# Patient Record
Sex: Female | Born: 1995 | Hispanic: Yes | Marital: Single | Smoking: Never smoker
Health system: Southern US, Community
[De-identification: ages and names within clinical notes are randomized; demographics above are authoritative.]

---

## 2014-03-10 ENCOUNTER — Ambulatory Visit: Payer: Self-pay | Admitting: Family Medicine

## 2014-08-05 ENCOUNTER — Inpatient Hospital Stay: Payer: Self-pay

## 2014-08-05 LAB — PROTEIN / CREATININE RATIO, URINE
Creatinine, Urine: 57.6 mg/dL (ref 30.0–125.0)
PROTEIN/CREAT. RATIO: 2969 mg/g{creat} — AB (ref 0–200)
Protein, Random Urine: 171 mg/dL — ABNORMAL HIGH (ref 0–12)

## 2014-08-05 LAB — CBC WITH DIFFERENTIAL/PLATELET
BASOS PCT: 0.4 %
Basophil #: 0 10*3/uL (ref 0.0–0.1)
EOS ABS: 0.2 10*3/uL (ref 0.0–0.7)
EOS PCT: 2.6 %
HCT: 38 % (ref 35.0–47.0)
HGB: 12.6 g/dL (ref 12.0–16.0)
LYMPHS ABS: 2.3 10*3/uL (ref 1.0–3.6)
Lymphocyte %: 27.5 %
MCH: 29 pg (ref 26.0–34.0)
MCHC: 33.1 g/dL (ref 32.0–36.0)
MCV: 87 fL (ref 80–100)
Monocyte #: 0.5 x10 3/mm (ref 0.2–0.9)
Monocyte %: 5.8 %
NEUTROS PCT: 63.7 %
Neutrophil #: 5.3 10*3/uL (ref 1.4–6.5)
Platelet: 130 10*3/uL — ABNORMAL LOW (ref 150–440)
RBC: 4.35 10*6/uL (ref 3.80–5.20)
RDW: 15.5 % — ABNORMAL HIGH (ref 11.5–14.5)
WBC: 8.3 10*3/uL (ref 3.6–11.0)

## 2014-08-05 LAB — GC/CHLAMYDIA PROBE AMP

## 2014-08-06 LAB — CBC WITH DIFFERENTIAL/PLATELET
BASOS PCT: 0.2 %
Basophil #: 0 10*3/uL (ref 0.0–0.1)
EOS ABS: 0 10*3/uL (ref 0.0–0.7)
Eosinophil %: 0.1 %
HCT: 25 % — AB (ref 35.0–47.0)
HGB: 7.9 g/dL — AB (ref 12.0–16.0)
Lymphocyte #: 2.2 10*3/uL (ref 1.0–3.6)
Lymphocyte %: 12.6 %
MCH: 27.9 pg (ref 26.0–34.0)
MCHC: 31.7 g/dL — AB (ref 32.0–36.0)
MCV: 88 fL (ref 80–100)
Monocyte #: 0.9 x10 3/mm (ref 0.2–0.9)
Monocyte %: 5.3 %
NEUTROS ABS: 14.1 10*3/uL — AB (ref 1.4–6.5)
Neutrophil %: 81.8 %
Platelet: 114 10*3/uL — ABNORMAL LOW (ref 150–440)
RBC: 2.84 10*6/uL — AB (ref 3.80–5.20)
RDW: 15.4 % — ABNORMAL HIGH (ref 11.5–14.5)
WBC: 17.2 10*3/uL — ABNORMAL HIGH (ref 3.6–11.0)

## 2014-08-06 LAB — BASIC METABOLIC PANEL
Anion Gap: 6 — ABNORMAL LOW (ref 7–16)
BUN: 22 mg/dL — AB (ref 7–18)
Calcium, Total: 7.7 mg/dL — ABNORMAL LOW (ref 8.5–10.1)
Chloride: 109 mmol/L — ABNORMAL HIGH (ref 98–107)
Co2: 24 mmol/L (ref 21–32)
Creatinine: 1.35 mg/dL — ABNORMAL HIGH (ref 0.60–1.30)
GFR CALC NON AF AMER: 53 — AB
GLUCOSE: 89 mg/dL (ref 65–99)
OSMOLALITY: 280 (ref 275–301)
POTASSIUM: 4.7 mmol/L (ref 3.5–5.1)
Sodium: 139 mmol/L (ref 136–145)

## 2014-08-06 LAB — PLATELET COUNT: PLATELETS: 121 10*3/uL — AB (ref 150–440)

## 2014-08-07 LAB — PIH PROFILE
ANION GAP: 6 — AB (ref 7–16)
AST: 28 U/L (ref 15–37)
BUN: 22 mg/dL — ABNORMAL HIGH (ref 7–18)
CALCIUM: 7.7 mg/dL — AB (ref 8.5–10.1)
Chloride: 110 mmol/L — ABNORMAL HIGH (ref 98–107)
Co2: 25 mmol/L (ref 21–32)
Creatinine: 1.05 mg/dL (ref 0.60–1.30)
EGFR (Non-African Amer.): >60
GLUCOSE: 82 mg/dL (ref 65–99)
HCT: 22.1 % — ABNORMAL LOW (ref 35.0–47.0)
HGB: 7.2 g/dL — ABNORMAL LOW (ref 12.0–16.0)
MCH: 28.8 pg (ref 26.0–34.0)
MCHC: 32.6 g/dL (ref 32.0–36.0)
MCV: 88 fL (ref 80–100)
Osmolality: 284 (ref 275–301)
Platelet: 105 10*3/uL — ABNORMAL LOW (ref 150–440)
Potassium: 4.4 mmol/L (ref 3.5–5.1)
RBC: 2.51 10*6/uL — ABNORMAL LOW (ref 3.80–5.20)
RDW: 16.1 % — AB (ref 11.5–14.5)
SODIUM: 141 mmol/L (ref 136–145)
URIC ACID: 6.6 mg/dL — AB (ref 2.6–6.0)
WBC: 11.8 10*3/uL — AB (ref 3.6–11.0)

## 2014-08-08 LAB — COMPREHENSIVE METABOLIC PANEL
Albumin: 1.8 g/dL — ABNORMAL LOW (ref 3.4–5.0)
Alkaline Phosphatase: 110 U/L (ref 46–116)
Anion Gap: 7 (ref 7–16)
BILIRUBIN TOTAL: 0.1 mg/dL — AB (ref 0.2–1.0)
BUN: 21 mg/dL — AB (ref 7–18)
CO2: 26 mmol/L (ref 21–32)
CREATININE: 0.81 mg/dL (ref 0.60–1.30)
Calcium, Total: 7.9 mg/dL — ABNORMAL LOW (ref 8.5–10.1)
Chloride: 110 mmol/L — ABNORMAL HIGH (ref 98–107)
EGFR (African American): >60
Glucose: 74 mg/dL (ref 65–99)
OSMOLALITY: 287 (ref 275–301)
POTASSIUM: 4.5 mmol/L (ref 3.5–5.1)
SGOT(AST): 33 U/L (ref 15–37)
SGPT (ALT): 24 U/L (ref 14–63)
SODIUM: 143 mmol/L (ref 136–145)
Total Protein: 4.7 g/dL — ABNORMAL LOW (ref 6.4–8.2)

## 2014-08-08 LAB — CBC WITH DIFFERENTIAL/PLATELET
BASOS ABS: 0 10*3/uL (ref 0.0–0.1)
Basophil %: 0.2 %
EOS ABS: 0.2 10*3/uL (ref 0.0–0.7)
EOS PCT: 2 %
HCT: 21.7 % — ABNORMAL LOW (ref 35.0–47.0)
HGB: 7.1 g/dL — ABNORMAL LOW (ref 12.0–16.0)
LYMPHS PCT: 21.4 %
Lymphocyte #: 2.1 10*3/uL (ref 1.0–3.6)
MCH: 28.8 pg (ref 26.0–34.0)
MCHC: 32.5 g/dL (ref 32.0–36.0)
MCV: 89 fL (ref 80–100)
Monocyte #: 0.6 x10 3/mm (ref 0.2–0.9)
Monocyte %: 5.7 %
Neutrophil #: 7 10*3/uL — ABNORMAL HIGH (ref 1.4–6.5)
Neutrophil %: 70.7 %
Platelet: 109 10*3/uL — ABNORMAL LOW (ref 150–440)
RBC: 2.45 10*6/uL — AB (ref 3.80–5.20)
RDW: 16 % — ABNORMAL HIGH (ref 11.5–14.5)
WBC: 9.9 10*3/uL (ref 3.6–11.0)

## 2014-08-09 LAB — CBC WITH DIFFERENTIAL/PLATELET
Basophil #: 0 10*3/uL (ref 0.0–0.1)
Basophil %: 0.1 %
EOS ABS: 0.3 10*3/uL (ref 0.0–0.7)
EOS PCT: 3.2 %
HCT: 21.8 % — ABNORMAL LOW (ref 35.0–47.0)
HGB: 7.2 g/dL — AB (ref 12.0–16.0)
LYMPHS PCT: 25 %
Lymphocyte #: 2.2 10*3/uL (ref 1.0–3.6)
MCH: 29 pg (ref 26.0–34.0)
MCHC: 32.8 g/dL (ref 32.0–36.0)
MCV: 89 fL (ref 80–100)
MONOS PCT: 4.7 %
Monocyte #: 0.4 x10 3/mm (ref 0.2–0.9)
Neutrophil #: 5.8 10*3/uL (ref 1.4–6.5)
Neutrophil %: 67 %
Platelet: 125 10*3/uL — ABNORMAL LOW (ref 150–440)
RBC: 2.47 10*6/uL — ABNORMAL LOW (ref 3.80–5.20)
RDW: 16.3 % — AB (ref 11.5–14.5)
WBC: 8.7 10*3/uL (ref 3.6–11.0)

## 2014-08-09 LAB — COMPREHENSIVE METABOLIC PANEL
ANION GAP: 8 (ref 7–16)
Albumin: 1.9 g/dL — ABNORMAL LOW (ref 3.4–5.0)
Alkaline Phosphatase: 106 U/L (ref 46–116)
BUN: 15 mg/dL (ref 7–18)
Bilirubin,Total: 0.1 mg/dL — ABNORMAL LOW (ref 0.2–1.0)
CALCIUM: 8 mg/dL — AB (ref 8.5–10.1)
Chloride: 109 mmol/L — ABNORMAL HIGH (ref 98–107)
Co2: 25 mmol/L (ref 21–32)
Creatinine: 0.71 mg/dL (ref 0.60–1.30)
EGFR (African American): >60
EGFR (Non-African Amer.): >60
Glucose: 78 mg/dL (ref 65–99)
OSMOLALITY: 283 (ref 275–301)
POTASSIUM: 4.1 mmol/L (ref 3.5–5.1)
SGOT(AST): 45 U/L — ABNORMAL HIGH (ref 15–37)
SGPT (ALT): 28 U/L (ref 14–63)
Sodium: 142 mmol/L (ref 136–145)
Total Protein: 5 g/dL — ABNORMAL LOW (ref 6.4–8.2)

## 2014-11-14 NOTE — H&P (Signed)
L&D Evaluation:  History:  HPI 19 y/o G1 @ 40/4wks EDC 07/31/14 here for IOL. Care @ [facility]. Well pregnancy. Irregular mild uc's, denies leaking fluid or vaginal bleeding, baby is active. Denies s/sx Pre/e no HA, visual disturbances, NV, RUQ or epigastric pain. GBS negative.   Presents with above   Patient's Medical History No Chronic Illness   Patient's Surgical History none   Medications Pre Natal Vitamins   Allergies NKDA   Social History none   Family History Non-Contributory   ROS:  ROS All systems were reviewed.  HEENT, CNS, GI, GU, Respiratory, CV, Renal and Musculoskeletal systems were found to be normal.   Exam:  Vital Signs stable  Initial B/P elevated Lower on left side   Urine Protein to lab   General no apparent distress   Mental Status clear   Chest clear   Heart normal sinus rhythm   Abdomen gravid, non-tender   Estimated Fetal Weight Average for gestational age   Fetal Position vtx   Fundal Height term   Back no CVAT   Edema 2+  pedal   Reflexes 2+   Clonus negative   Pelvic no external lesions, 4cm 70% vtx @ -2 BOWI no show   Mebranes Intact   FHT normal rate with no decels, baseline 140's 150's avg variability with accels   Fetal Heart Rate 144   Ucx EFM applied   Skin dry   Lymph no lymphadenopathy   Impression:  Impression early labor   Plan:  Plan EFM/NST, monitor contractions and for cervical change   Comments Admitted, explained what to expect 1st baby. Family partner at bedside, supportive. Plans epidural with labor progress. Will insert cervidil.   Electronic Signatures: Albertina ParrLugiano, Matilynn Dacey B (CNM)  (Signed 30-Jan-16 20:55)  Authored: L&D Evaluation   Last Updated: 30-Jan-16 20:55 by Albertina ParrLugiano, Chesney Suares B (CNM)

## 2015-05-05 IMAGING — US US OB US >=[ID] SNGL FETUS
1 series · 14 of 28 positions shown · non-contrast
Comparison: none

CLINICAL DATA: Pregnancy.

EXAM:
ULTRASOUND OB >=UXSKG SINGLE FETUS

[Series 1: us ob us >=(id) sngl fetus · 0.23mm/px · 14 of 76 slices shown]
[im 3/76]
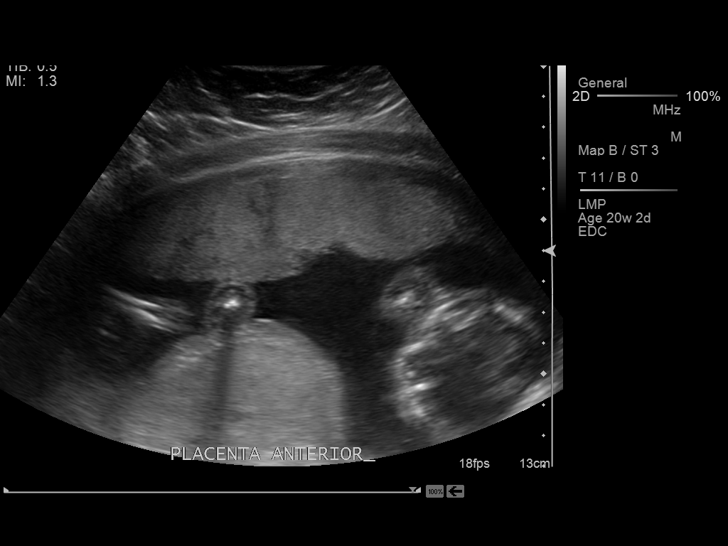
[im 9/76]
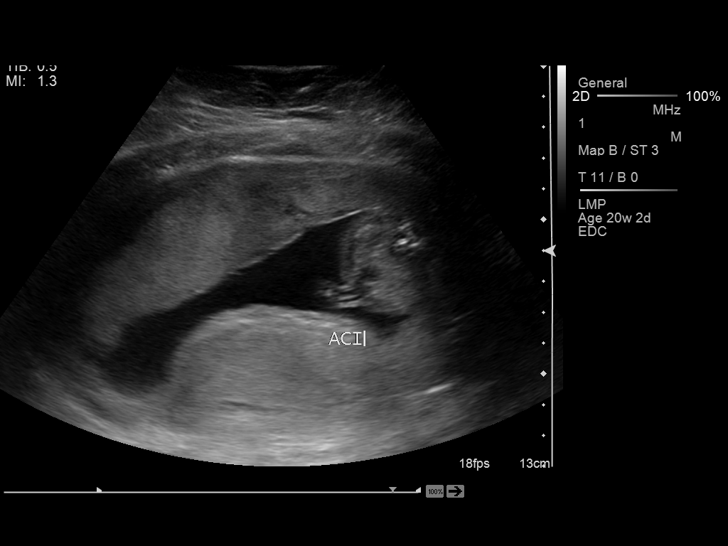
[im 14/76]
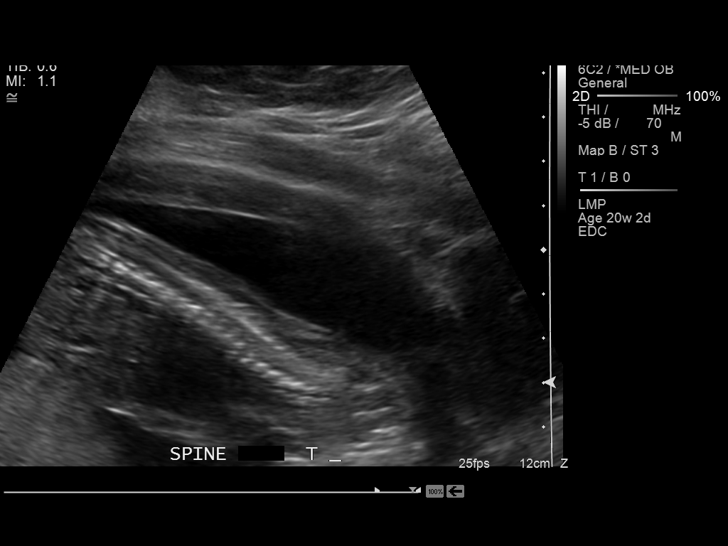
[im 20/76]
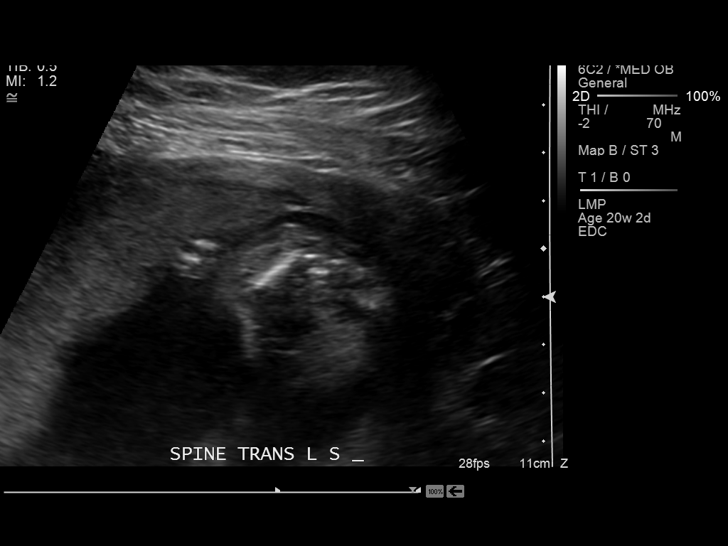
[im 26/76]
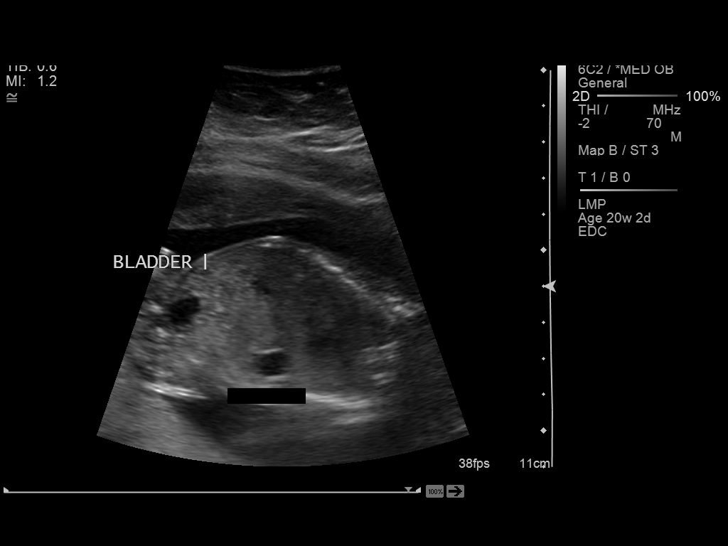
[im 31/76]
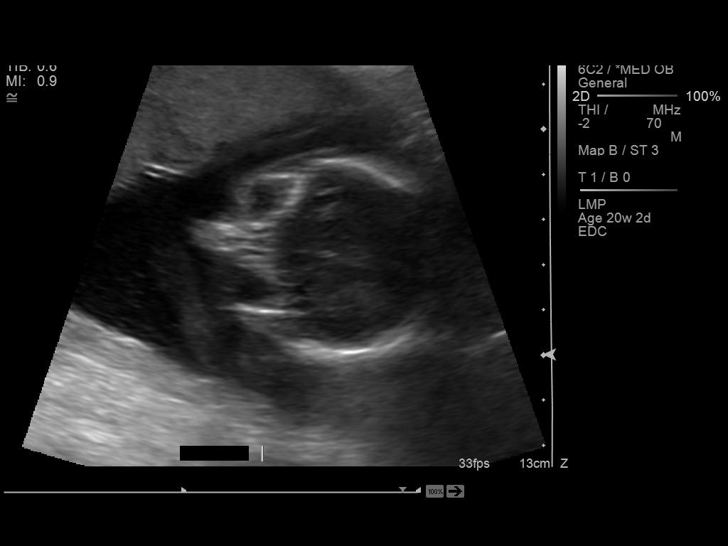
[im 37/76]
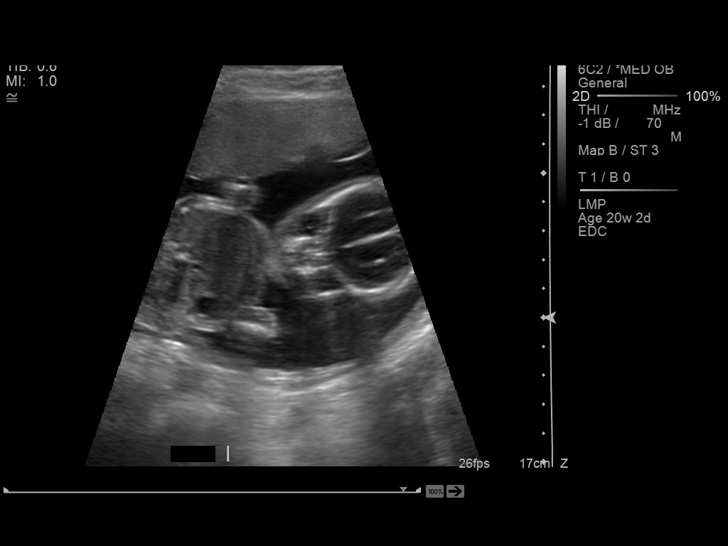
[im 42/76]
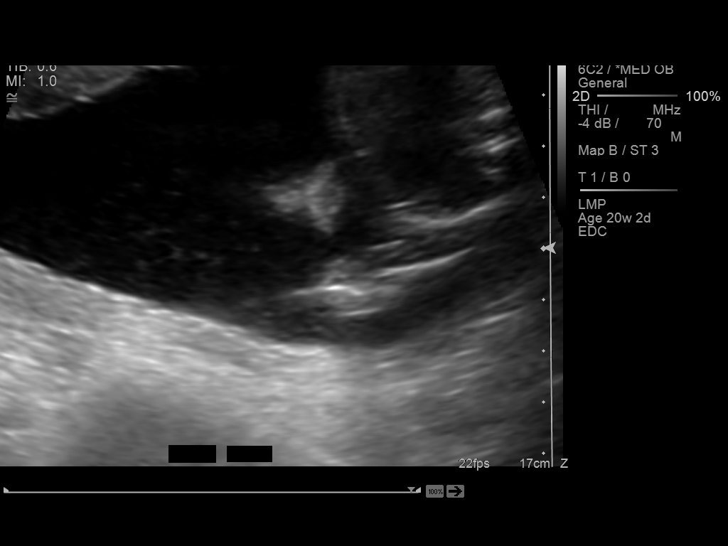
[im 48/76]
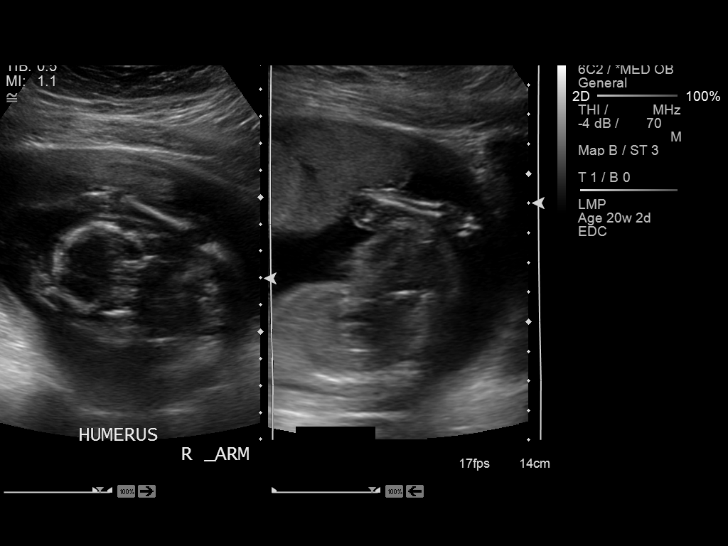
[im 53/76]
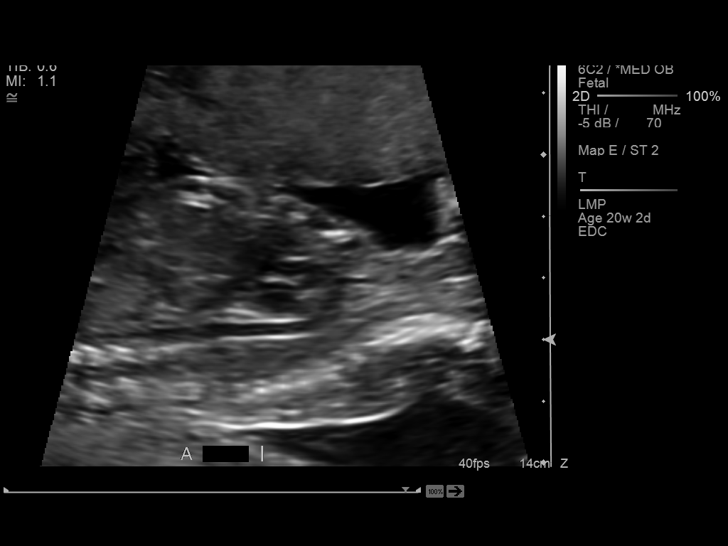
[im 59/76]
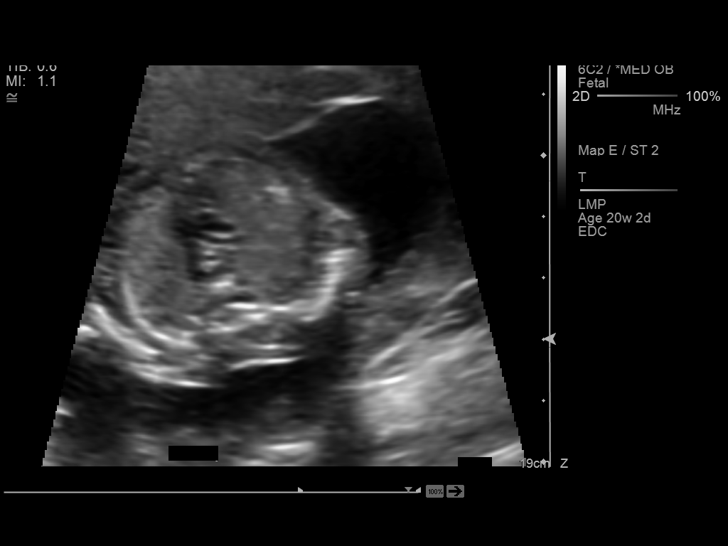
[im 64/76]
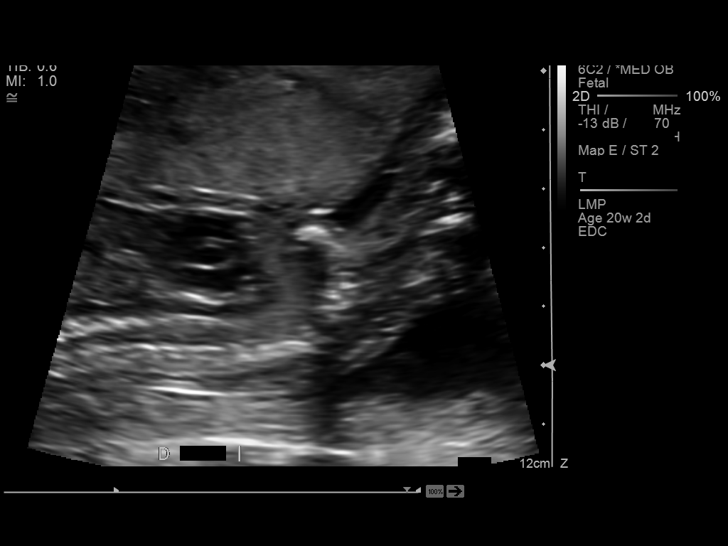
[im 70/76]
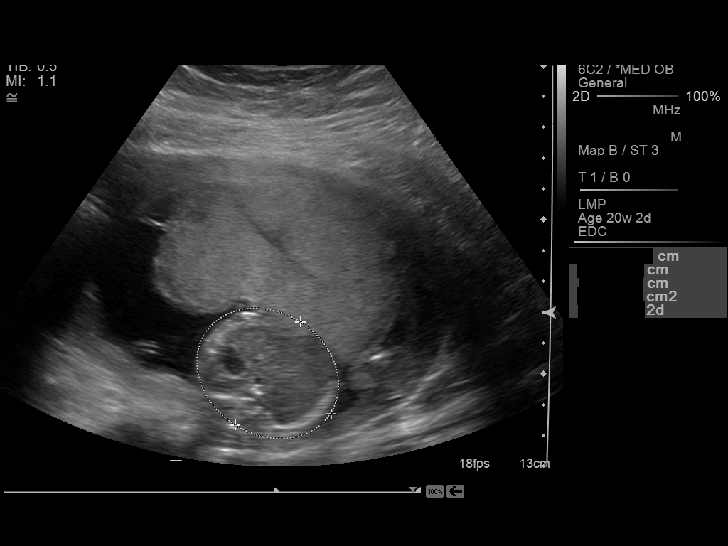
[im 76/76]
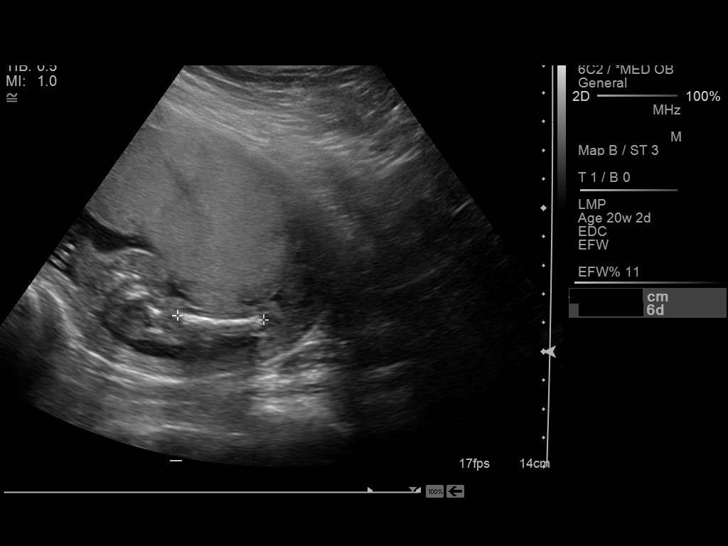

[14 of 28 positions shown; findings below may reference images not displayed]

FINDINGS: Number of Fetuses: 1

Heart Rate:  143 bpm

Movement: Present.

Presentation: Variable.

Previa: None.

Placental Location: Anterior body.

Amniotic Fluid (Subjective): Normal.

FETAL BIOMETRY

BPD:  4.5cm 19w 3d

HC:    16.9cm  19w   4d

AC:   14.0cm  19w   2d

FL:   3.1cm  19w   4d

Current Mean GA: 19w 4d

US EDC: 07/31/2013

FETAL ANATOMY

Visualized portions of the fetal head, neck, chest, abdomen, pelvis,
extremities are unremarkable. Fetal anatomy evaluation and a
tertiary or high risk Center should be considered.

Maternal Findings:

Cervix:  6.1  cm  closed.
IMPRESSION: Single viable intrauterine pregnancy of 19 weeks 4 days.

## 2016-12-02 ENCOUNTER — Other Ambulatory Visit: Payer: Self-pay | Admitting: Primary Care

## 2016-12-02 DIAGNOSIS — Z3201 Encounter for pregnancy test, result positive: Secondary | ICD-10-CM

## 2016-12-05 ENCOUNTER — Ambulatory Visit: Payer: Self-pay

## 2019-10-03 ENCOUNTER — Ambulatory Visit (INDEPENDENT_AMBULATORY_CARE_PROVIDER_SITE_OTHER): Payer: Self-pay | Admitting: Advanced Practice Midwife

## 2019-10-03 ENCOUNTER — Other Ambulatory Visit (HOSPITAL_COMMUNITY)
Admission: RE | Admit: 2019-10-03 | Discharge: 2019-10-03 | Disposition: A | Discharge status: Home / Self Care | Payer: Self-pay | Source: Ambulatory Visit | Attending: Advanced Practice Midwife | Admitting: Advanced Practice Midwife

## 2019-10-03 ENCOUNTER — Encounter: Payer: Self-pay | Admitting: Advanced Practice Midwife

## 2019-10-03 ENCOUNTER — Other Ambulatory Visit: Payer: Self-pay

## 2019-10-03 VITALS — BP 100/70 | Ht 65.0 in | Wt 217.0 lb

## 2019-10-03 DIAGNOSIS — Z124 Encounter for screening for malignant neoplasm of cervix: Secondary | ICD-10-CM

## 2019-10-03 DIAGNOSIS — L680 Hirsutism: Secondary | ICD-10-CM

## 2019-10-03 DIAGNOSIS — Z01419 Encounter for gynecological examination (general) (routine) without abnormal findings: Secondary | ICD-10-CM

## 2019-10-03 DIAGNOSIS — N925 Other specified irregular menstruation: Secondary | ICD-10-CM

## 2019-10-03 DIAGNOSIS — N926 Irregular menstruation, unspecified: Secondary | ICD-10-CM

## 2019-10-03 MED ORDER — NORGESTIM-ETH ESTRAD TRIPHASIC 0.18/0.215/0.25 MG-35 MCG PO TABS: 1.0000 | ORAL_TABLET | Freq: Every day | ORAL | 4 refills | Status: AC

## 2019-10-03 NOTE — Progress Notes (Addendum)
Gynecology Annual Exam  Date of Service: 10/03/2019  PCP: Patient, No Pcp Per  Chief Complaint:  Chief Complaint  Patient presents with  . Gynecologic Exam  . Amenorrhea    History of Present Illness: Patient is a 24 y.o. [id number] presents for annual exam. The patient has complaint today of irregular periods. She reports her periods were irregular prior to having her children who are 93 and 21 years old. Most recently her periods have occurred every 3-12 months, however, she has premenstrual symptoms every 2 months. Her periods last for 1 day and are light. She denies ever having used hormonal birth control. She admits facial hair. She admits extra stress in the past year. We discussed the possibility that she has PCOS and having labs done today. She is self-pay and unable to cover the cost of the lab work.   She has never had a PAP smear. 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   LMP: per patient- March 2020  Postcoital Bleeding: no Dysmenorrhea: no  The patient is sexually active. She currently uses none for contraception. She denies dyspareunia.  The patient does perform self breast exams.  There is no notable family history of breast or ovarian cancer in her family.  The patient wears seatbelts: yes.  The patient has regular exercise: she admits activity with her children. She tries to eat healthy. She primarily drinks juice. She admits adequate sleep.    The patient denies current symptoms of depression.    Review of Systems: Review of Systems  Constitutional: Negative.   HENT: Negative.   Eyes: Negative.   Respiratory: Negative.   Cardiovascular: Negative.   Gastrointestinal: Negative.   Genitourinary: Negative.   Musculoskeletal: Negative.   Skin: Negative.   Neurological: Negative.   Endo/Heme/Allergies:       Irregular periods   Psychiatric/Behavioral: Negative.     Past Medical History:  There are no problems to display for this patient.   Past Surgical  History:  History reviewed. No pertinent surgical history.  Gynecologic History:  No LMP recorded. Contraception: none Last Pap: never per patient report- unable to find a record of PAP in chart  Obstetric History: [id number]  Family History:  History reviewed. No pertinent family history.  Social History:  Social History   Socioeconomic History  . Marital status: Single    Spouse name: Not on file  . Number of children: Not on file  . Years of education: Not on file  . Highest education level: Not on file  Occupational History  . Not on file  Tobacco Use  . Smoking status: Never Smoker  . Smokeless tobacco: Never Used  Substance and Sexual Activity  . Alcohol use: Never  . Drug use: Never  . Sexual activity: Yes    Birth control/protection: None  Other Topics Concern  . Not on file  Social History Narrative  . Not on file   Social Determinants of Health   Financial Resource Strain:   . Difficulty of Paying Living Expenses:   Food Insecurity:   . Worried About Programme researcher, broadcasting/film/video in the Last Year:   . Barista in the Last Year:   Transportation Needs:   . Freight forwarder (Medical):   Marland Kitchen Lack of Transportation (Non-Medical):   Physical Activity:   . Days of Exercise per Week:   . Minutes of Exercise per Session:   Stress:   . Feeling of Stress :  Social Connections:   . Frequency of Communication with Friends and Family:   . Frequency of Social Gatherings with Friends and Family:   . Attends Religious Services:   . Active Member of Clubs or Organizations:   . Attends Archivist Meetings:   Marland Kitchen Marital Status:   Intimate Partner Violence:   . Fear of Current or Ex-Partner:   . Emotionally Abused:   Marland Kitchen Physically Abused:   . Sexually Abused:     Allergies:  Not on File  Medications: Prior to Admission medications   Medication Sig Start Date End Date Taking? Authorizing Provider  Norgestimate-Ethinyl Estradiol Triphasic  (TRI-SPRINTEC) 0.18/0.215/0.25 MG-35 MCG tablet Take 1 tablet by mouth daily. 10/03/19   Rod Can, CNM    Physical Exam Vitals: Blood pressure 100/70, height 5\' 5"  (1.651 m), weight 217 lb (98.4 kg).  General: NAD HEENT: normocephalic, anicteric Thyroid: no enlargement, no palpable nodules Pulmonary: No increased work of breathing, CTAB Cardiovascular: RRR, distal pulses 2+ Breast: Breast symmetrical, no tenderness, no palpable nodules or masses, no skin or nipple retraction present, no nipple discharge.  No axillary or supraclavicular lymphadenopathy. Abdomen: NABS, soft, non-tender, non-distended.  Umbilicus without lesions.  No hepatomegaly, splenomegaly or masses palpable. No evidence of hernia  Genitourinary:  External: Normal external female genitalia.  Normal urethral meatus, normal Bartholin's and Skene's glands.    Vagina: Normal vaginal mucosa, no evidence of prolapse.    Cervix: Grossly normal in appearance, no bleeding, no CMT  Uterus: Non-enlarged, mobile, normal contour.    Adnexa: ovaries non-enlarged, no adnexal masses  Rectal: deferred  Lymphatic: no evidence of inguinal lymphadenopathy Extremities: no edema, erythema, or tenderness Neurologic: Grossly intact Psychiatric: mood appropriate, affect full    Assessment: 24 y.o. [id number] routine annual exam, likely PCOS  Plan: Problem List Items Addressed This Visit    None    Visit Diagnoses    Well woman exam with routine gynecological exam    -  Primary   Relevant Orders   Cytology - PAP   Irregular periods/menstrual cycles       Relevant Medications   Norgestimate-Ethinyl Estradiol Triphasic (TRI-SPRINTEC) 0.18/0.215/0.25 MG-35 MCG tablet   Cervical cancer screening       Relevant Orders   Cytology - PAP   Hirsutism          1) 4) Gardasil Series discussed and if applicable offered to patient - Patient has not previously completed 3 shot series   2) STI screening  was offered and declined  3)   ASCCP guidelines and rationale discussed.  Patient opts for every 3 years screening interval. First PAP smear today.  4) Contraception - the patient is currently using  none.  She is interested in changing to [facility] to regulate cycle. We discussed safe sex practices to reduce her furture risk of STI's.    5) Unable to afford PCOS labs today. Patient may apply for Medicaid and let us know if she would like to have labs in future. We will start with OCP to regulate cycles.  6) Recommend increase healthy lifestyle diet, exercise, healthy weight.  7) Return in about 1 year (around 10/02/2020) for annual established gyn.   Rod Can, [facility] 10/03/2019, 4:55 PM

## 2019-10-03 NOTE — Patient Instructions (Addendum)
Diet for Polycystic Ovary Syndrome Polycystic ovary syndrome (PCOS) is a disorder of the chemicals (hormones) that regulate a woman's reproductive system, including monthly periods (menstruation). The condition causes important hormones to be out of balance. PCOS can:  Stop your periods or make them irregular.  Cause cysts to develop on your ovaries.  Make it difficult to get pregnant.  Stop your body from responding to the effects of insulin (insulin resistance). Insulin resistance can lead to obesity and diabetes. Changing what you eat can help you manage PCOS and improve your health. Following a balanced diet can help you lose weight and improve the way that your body uses insulin. What are tips for following this plan?  Follow a balanced diet for meals and snacks. Eat breakfast, lunch, dinner, and one or two snacks every day.  Include protein in each meal and snack.  Choose whole grains instead of products that are made with refined flour.  Eat a variety of foods.  Exercise regularly as told by your health care provider. Aim to do 30 or more minutes of exercise on most days of the week.  If you are overweight or obese: ? Pay attention to how many calories you eat. Cutting down on calories can help you lose weight. ? Work with your health care provider or a diet and nutrition specialist (dietitian) to figure out how many calories you need each day. What foods can I eat?  Fruits Include a variety of colors and types. All fruits are helpful for PCOS. Vegetables Include a variety of colors and types. All vegetables are helpful for PCOS. Grains Whole grains, such as whole wheat. Whole-grain breads, crackers, cereals, and pasta. Unsweetened oatmeal, bulgur, barley, quinoa, and brown rice. Tortillas made from corn or whole-wheat flour. Meats and other proteins Low-fat (lean) proteins, such as fish, chicken, beans, eggs, and tofu. Dairy Low-fat dairy products, such as skim milk,  cheese sticks, and yogurt. Beverages Low-fat or fat-free drinks, such as water, low-fat milk, sugar-free drinks, and small amounts of 100% fruit juice. Seasonings and condiments Ketchup. Mustard. Barbecue sauce. Relish. Low-fat or fat-free mayonnaise. Fats and oils Olive oil or canola oil. Walnuts and almonds. The items listed above may not be a complete list of recommended foods and beverages. Contact a dietitian for more options. What foods are not recommended? Foods that are high in calories or fat. Fried foods. Sweets. Products that are made from refined white flour, including white bread, pastries, white rice, and pasta. The items listed above may not be a complete list of foods and beverages to avoid. Contact a dietitian for more information. Summary  PCOS is a hormonal imbalance that affects a woman's reproductive system.  You can help to manage your PCOS by exercising regularly and eating a healthy, varied diet of vegetables, fruit, whole grains, low-fat (lean) protein, and low-fat dairy products.  Changing what you eat can improve the way that your body uses insulin, help your hormones reach normal levels, and help you lose weight. This information is not intended to replace advice given to you by your health care provider. Make sure you discuss any questions you have with your health care provider. Document Revised: 10/13/2018 Document Reviewed: 04/27/2017 Elsevier Patient Education  2020 Else[employer] Polycystic Ovarian Syndrome  Polycystic ovarian syndrome (PCOS) is a common hormonal disorder among women of reproductive age. In most women with PCOS, many small fluid-filled sacs (cysts) grow on the ovaries, and the cysts are not part of a normal menstrual cycle.   PCOS can cause problems with your menstrual periods and make it difficult to get pregnant. It can also cause an increased risk of miscarriage with pregnancy. If it is not treated, PCOS can lead to serious health problems,  such as diabetes and heart disease. What are the causes? The cause of PCOS is not known, but it may be the result of a combination of certain factors, such as:  Irregular menstrual cycle.  High levels of certain hormones (androgens).  Problems with the hormone that helps to control blood sugar (insulin resistance).  Certain genes. What increases the risk? This condition is more likely to develop in women who have a family history of PCOS. What are the signs or symptoms? Symptoms of PCOS may include:  Multiple ovarian cysts.  Infrequent periods or no periods.  Periods that are too frequent or too heavy.  Unpredictable periods.  Inability to get pregnant (infertility) because of not ovulating.  Increased growth of hair on the face, chest, stomach, back, thumbs, thighs, or toes.  Acne or oily skin. Acne may develop during adulthood, and it may not respond to treatment.  Pelvic pain.  Weight gain or obesity.  Patches of thickened and dark brown or black skin on the neck, arms, breasts, or thighs (acanthosis nigricans).  Excess hair growth on the face, chest, abdomen, or upper thighs (hirsutism). How is this diagnosed? This condition is diagnosed based on:  Your medical history.  A physical exam, including a pelvic exam. Your health care provider may look for areas of increased hair growth on your skin.  Tests, such as: ? Ultrasound. This may be used to examine the ovaries and the lining of the uterus (endometrium) for cysts. ? Blood tests. These may be used to check levels of sugar (glucose), female hormone (testosterone), and female hormones (estrogen and progesterone) in your blood. How is this treated? There is no cure for PCOS, but treatment can help to manage symptoms and prevent more health problems from developing. Treatment varies depending on:  Your symptoms.  Whether you want to have a baby or whether you need birth control (contraception). Treatment may  include nutrition and lifestyle changes along with:  Progesterone hormone to start a menstrual period.  Birth control pills to help you have regular menstrual periods.  Medicines to make you ovulate, if you want to get pregnant.  Medicine to reduce excessive hair growth.  Surgery, in severe cases. This may involve making small holes in one or both of your ovaries. This decreases the amount of testosterone that your body produces. Follow these instructions at home:  Take over-the-counter and prescription medicines only as told by your health care provider.  Follow a healthy meal plan. This can help you reduce the effects of PCOS. ? Eat a healthy diet that includes lean proteins, complex carbohydrates, fresh fruits and vegetables, low-fat dairy products, and healthy fats. Make sure to eat enough fiber.  If you are overweight, lose weight as told by your health care provider. ? Losing 10% of your body weight may improve symptoms. ? Your health care provider can determine how much weight loss is best for you and can help you lose weight safely.  Keep all follow-up visits as told by your health care provider. This is important. Contact a health care provider if:  Your symptoms do not get better with medicine.  You develop new symptoms. This information is not intended to replace advice given to you by your health care provider. Make sure you   discuss any questions you have with your health care provider. Document Revised: 06/05/2017 Document Reviewed: 12/09/2015 Elsevier Patient Education  2020 [place] Maintenance, Female Adopting a healthy lifestyle and getting preventive care are important in promoting health and wellness. Ask your health care provider about:  The right schedule for you to have regular tests and exams.  Things you can do on your own to prevent diseases and keep yourself healthy. What should I know about diet, weight, and exercise? Eat a healthy  diet   Eat a diet that includes plenty of vegetables, fruits, low-fat dairy products, and lean protein.  Do not eat a lot of foods that are high in solid fats, added sugars, or sodium. Maintain a healthy weight Body mass index (BMI) is used to identify weight problems. It estimates body fat based on height and weight. Your health care provider can help determine your BMI and help you achieve or maintain a healthy weight. Get regular exercise Get regular exercise. This is one of the most important things you can do for your health. Most adults should:  Exercise for at least 150 minutes each week. The exercise should increase your heart rate and make you sweat (moderate-intensity exercise).  Do strengthening exercises at least twice a week. This is in addition to the moderate-intensity exercise.  Spend less time sitting. Even light physical activity can be beneficial. Watch cholesterol and blood lipids Have your blood tested for lipids and cholesterol at 25 years of age, then have this test every 5 years. Have your cholesterol levels checked more often if:  Your lipid or cholesterol levels are high.  You are older than 25 years of age.  You are at high risk for heart disease. What should I know about cancer screening? Depending on your health history and family history, you may need to have cancer screening at various ages. This may include screening for:  Breast cancer.  Cervical cancer.  Colorectal cancer.  Skin cancer.  Lung cancer. What should I know about heart disease, diabetes, and high blood pressure? Blood pressure and heart disease  High blood pressure causes heart disease and increases the risk of stroke. This is more likely to develop in people who have high blood pressure readings, are of African descent, or are overweight.  Have your blood pressure checked: ? Every 3-5 years if you are 57-64 years of age. ? Every year if you are 27 years old or  older. Diabetes Have regular diabetes screenings. This checks your fasting blood sugar level. Have the screening done:  Once every three years after age 24 if you are at a normal weight and have a low risk for diabetes.  More often and at a younger age if you are overweight or have a high risk for diabetes. What should I know about preventing infection? Hepatitis B If you have a higher risk for hepatitis B, you should be screened for this virus. Talk with your health care provider to find out if you are at risk for hepatitis B infection. Hepatitis C Testing is recommended for:  Everyone born from [birth year] through 1966.  Anyone with known risk factors for hepatitis C. Sexually transmitted infections (STIs)  Get screened for STIs, including gonorrhea and chlamydia, if: ? You are sexually active and are younger than 25 years of age. ? You are older than 25 years of age and your health care provider tells you that you are at risk for this type of infection. ? Your sexual  activity has changed since you were last screened, and you are at increased risk for chlamydia or gonorrhea. Ask your health care provider if you are at risk.  Ask your health care provider about whether you are at high risk for HIV. Your health care provider may recommend a prescription medicine to help prevent HIV infection. If you choose to take medicine to prevent HIV, you should first get tested for HIV. You should then be tested every 3 months for as long as you are taking the medicine. Pregnancy  If you are about to stop having your period (premenopausal) and you may become pregnant, seek counseling before you get pregnant.  Take 400 to 800 micrograms (mcg) of folic acid every day if you become pregnant.  Ask for birth control (contraception) if you want to prevent pregnancy. Osteoporosis and menopause Osteoporosis is a disease in which the bones lose minerals and strength with aging. This can result in bone fractures.  If you are 66 years old or older, or if you are at risk for osteoporosis and fractures, ask your health care provider if you should:  Be screened for bone loss.  Take a calcium or vitamin D supplement to lower your risk of fractures.  Be given hormone replacement therapy (HRT) to treat symptoms of menopause. Follow these instructions at home: Lifestyle  Do not use any products that contain nicotine or tobacco, such as cigarettes, e-cigarettes, and chewing tobacco. If you need help quitting, ask your health care provider.  Do not use street drugs.  Do not share needles.  Ask your health care provider for help if you need support or information about quitting drugs. Alcohol use  Do not drink alcohol if: ? Your health care provider tells you not to drink. ? You are pregnant, may be pregnant, or are planning to become pregnant.  If you drink alcohol: ? Limit how much you use to 0-1 drink a day. ? Limit intake if you are breastfeeding.  Be aware of how much alcohol is in your drink. In the [place] one drink equals one 12 oz bottle of beer (355 mL), one 5 oz glass of wine (148 mL), or one 1 oz glass of hard liquor (44 mL). General instructions  Schedule regular health, dental, and eye exams.  Stay current with your vaccines.  Tell your health care provider if: ? You often feel depressed. ? You have ever been abused or do not feel safe at home. Summary  Adopting a healthy lifestyle and getting preventive care are important in promoting health and wellness.  Follow your health care provider's instructions about healthy diet, exercising, and getting tested or screened for diseases.  Follow your health care provider's instructions on monitoring your cholesterol and blood pressure. This information is not intended to replace advice given to you by your health care provider. Make sure you discuss any questions you have with your health care provider. Document Revised: 06/16/2018  Document Reviewed: 06/16/2018 Elsevier Patient Education  2020 [employer].

## 2019-10-05 LAB — CYTOLOGY - PAP: Diagnosis: NEGATIVE
